# Patient Record
Sex: Male | Born: 1945 | Race: White | Hispanic: No | Marital: Married
Health system: Southern US, Community
[De-identification: ages and names within clinical notes are randomized; demographics above are authoritative.]

---

## 2004-04-18 ENCOUNTER — Ambulatory Visit (HOSPITAL_COMMUNITY): Admission: RE | Admit: 2004-04-18 | Discharge: 2004-04-18 | Payer: Self-pay | Admitting: Gastroenterology

## 2004-12-06 ENCOUNTER — Ambulatory Visit (HOSPITAL_COMMUNITY): Admission: RE | Admit: 2004-12-06 | Discharge: 2004-12-06 | Payer: Self-pay | Admitting: Urology

## 2015-01-09 ENCOUNTER — Other Ambulatory Visit (INDEPENDENT_AMBULATORY_CARE_PROVIDER_SITE_OTHER): Payer: Self-pay | Admitting: General Surgery

## 2015-01-09 DIAGNOSIS — R1031 Right lower quadrant pain: Secondary | ICD-10-CM

## 2015-01-09 NOTE — Addendum Note (Signed)
Addended by: Ernestene MentionINGRAM, Orren Pietsch M on: 01/09/2015 03:04 PM   Modules accepted: Orders

## 2015-01-10 ENCOUNTER — Other Ambulatory Visit (INDEPENDENT_AMBULATORY_CARE_PROVIDER_SITE_OTHER): Payer: Self-pay

## 2015-01-10 DIAGNOSIS — R1031 Right lower quadrant pain: Secondary | ICD-10-CM

## 2015-01-12 ENCOUNTER — Ambulatory Visit
Admission: RE | Admit: 2015-01-12 | Discharge: 2015-01-12 | Disposition: A | Discharge status: Home / Self Care | Payer: Medicare Other | Source: Ambulatory Visit | Attending: General Surgery | Admitting: General Surgery

## 2016-01-03 IMAGING — CT CT PELVIS W/ CM
3 series · 13 of 36 positions shown, 19 images · IV contrast (READICAT/WATER & [ID] ISOVUE 300)
Comparison: 12/05/2004.

CLINICAL DATA: Right groin pain. Previous right inguinal hernia
repair in 8111. History of nephrolithiasis and lithotripsy.

EXAM:
CT PELVIS WITH CONTRAST
TECHNIQUE: Multidetector CT imaging of the pelvis was performed using the
standard protocol following the bolus administration of intravenous
contrast.
CONTRAST:  125 cc Isovue 300

[Series 3: routine pelvis · axial · 0.81mm/px · z∈[-306,-71]mm · 7 of 63 slices shown, 12 images]
[im 8/63  soft-tissue]
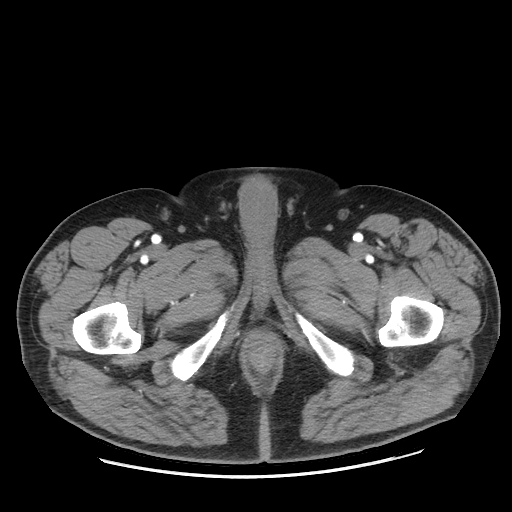
[im 8/63  bone]
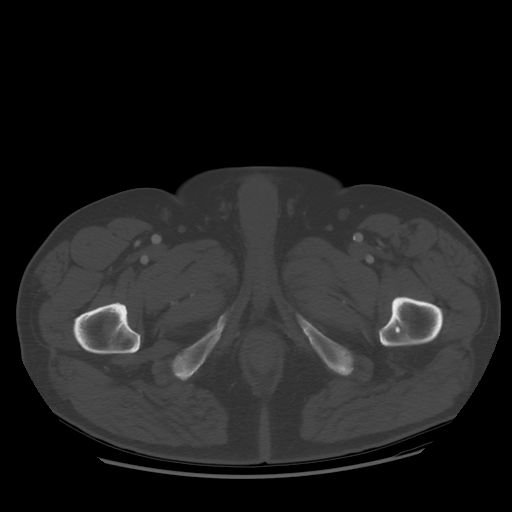
[im 16/63  soft-tissue]
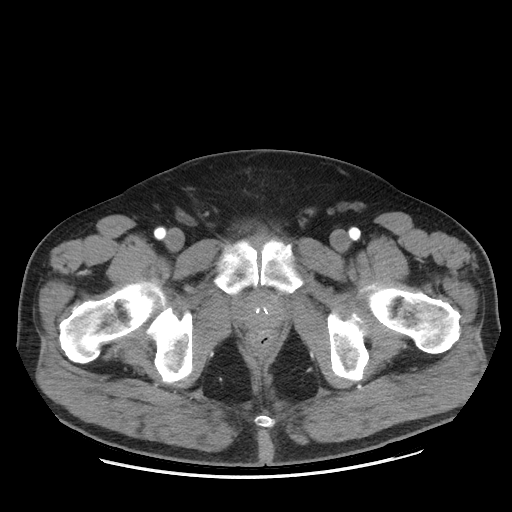
[im 24/63  soft-tissue]
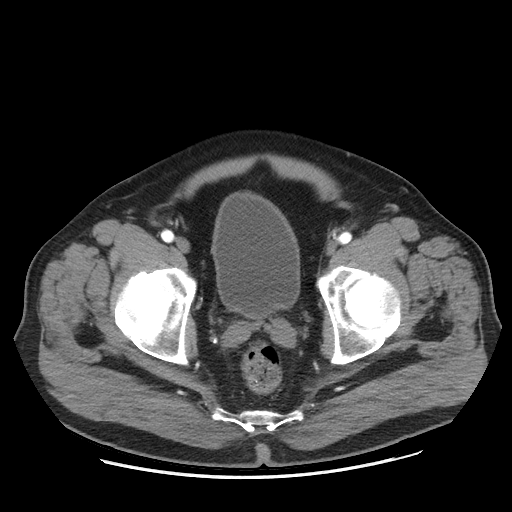
[im 32/63  soft-tissue]
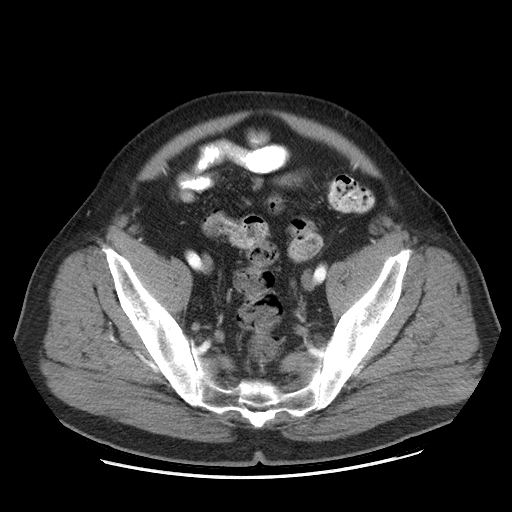
[im 32/63  lung]
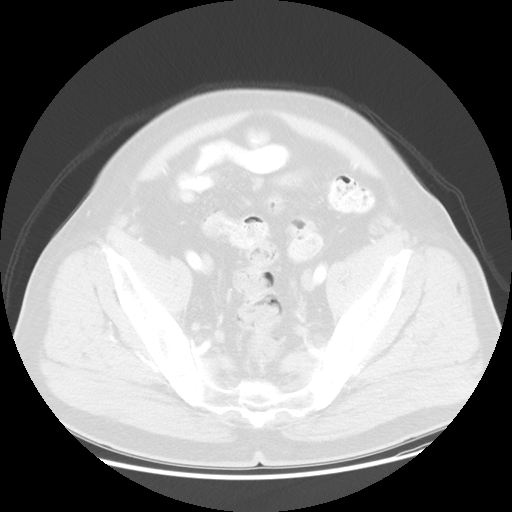
[im 39/63  soft-tissue]
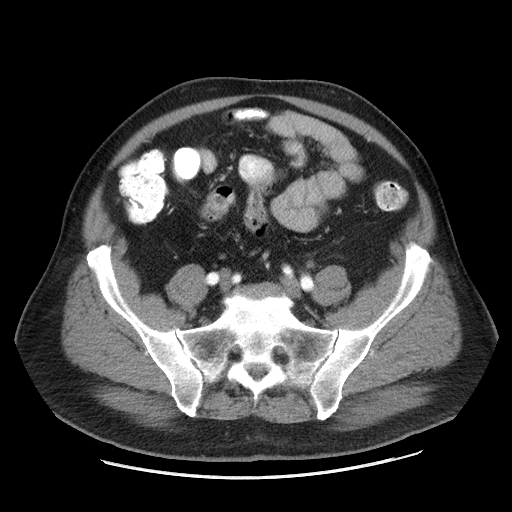
[im 39/63  lung]
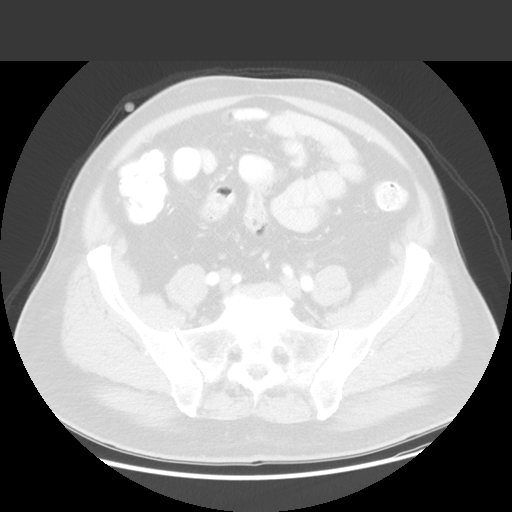
[im 47/63  soft-tissue]
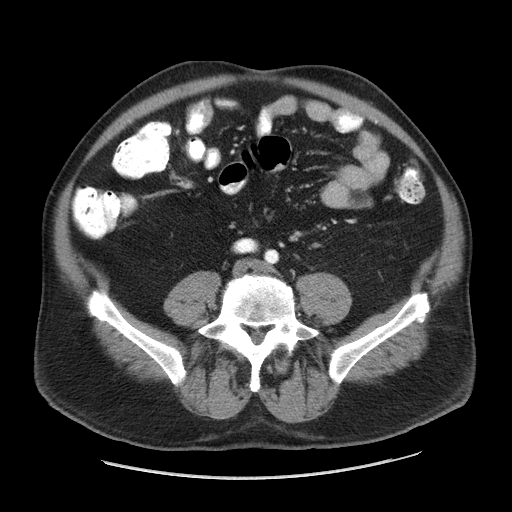
[im 47/63  lung]
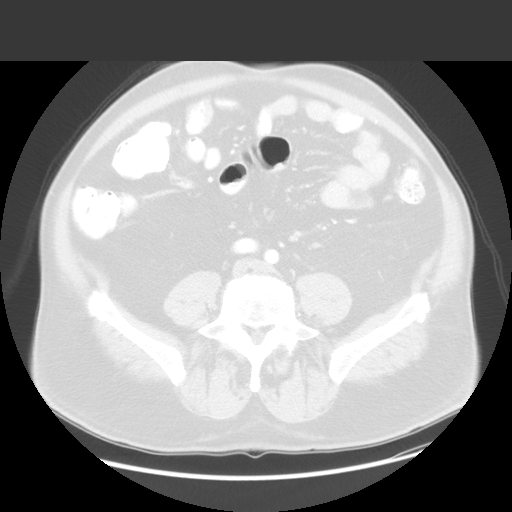
[im 55/63  soft-tissue]
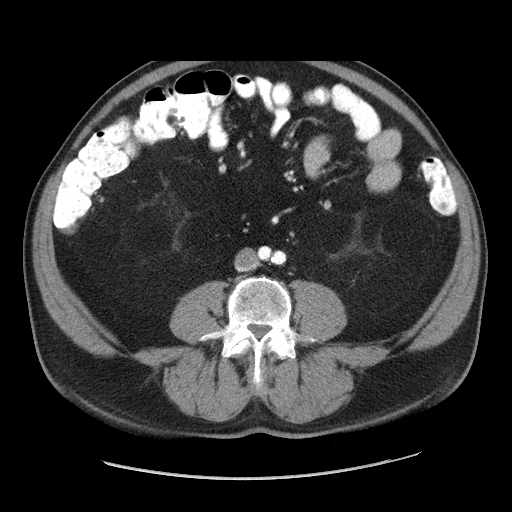
[im 55/63  lung]
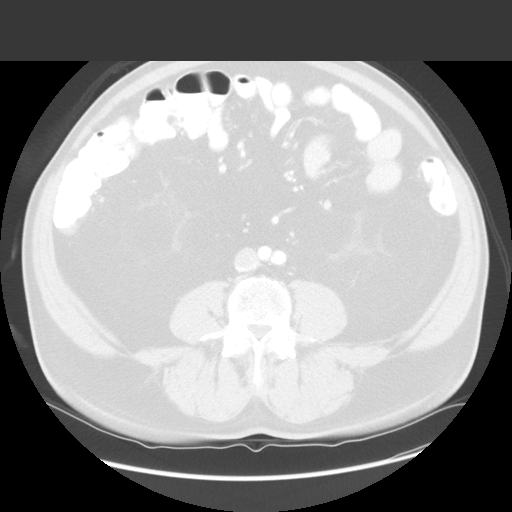

[Series 601: coronal body · coronal · 0.81mm/px · 1 of 142 slices shown, 2 images]
[im 48/142  soft-tissue]
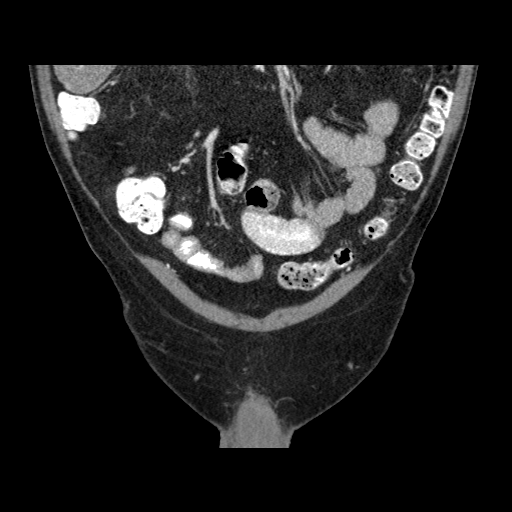
[im 48/142  bone]
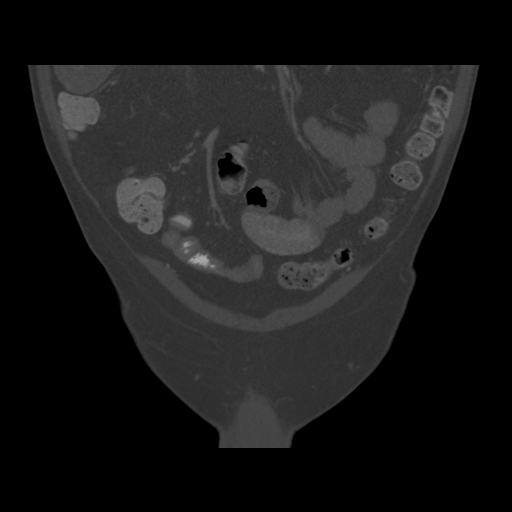

[Series 602: sagittal body · sagittal · 0.81mm/px · 5 of 161 slices shown]
[im 15/161  soft-tissue]
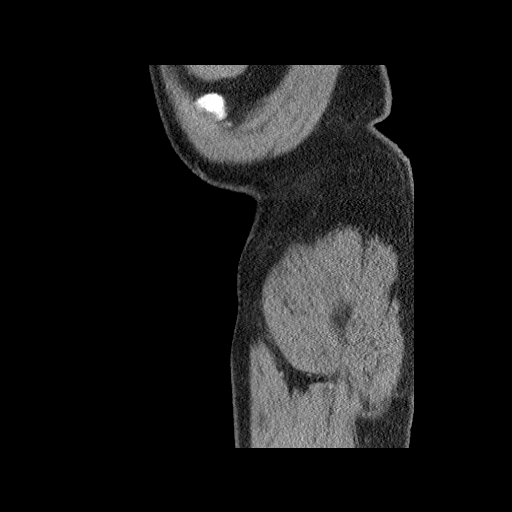
[im 30/161  soft-tissue]
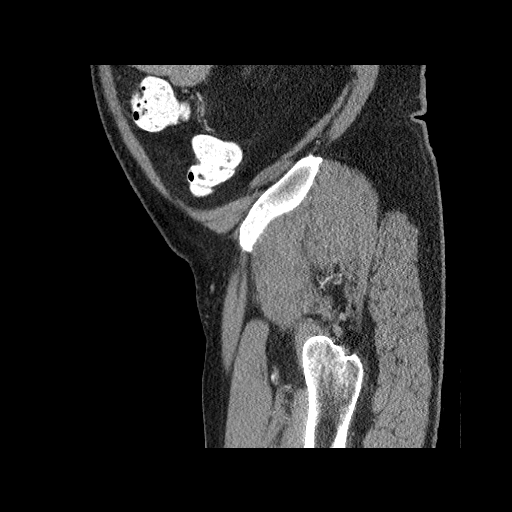
[im 51/161  soft-tissue]
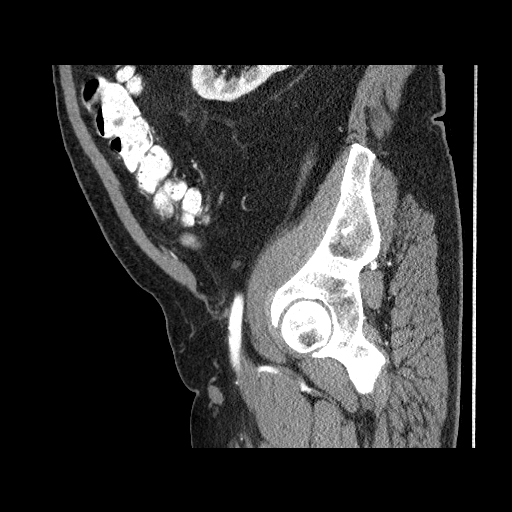
[im 73/161  soft-tissue]
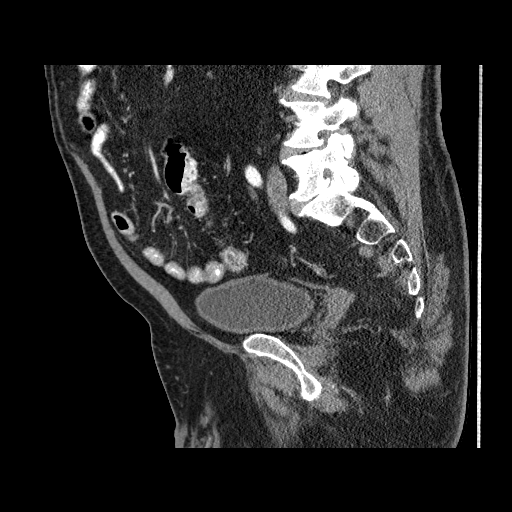
[im 88/161  soft-tissue]
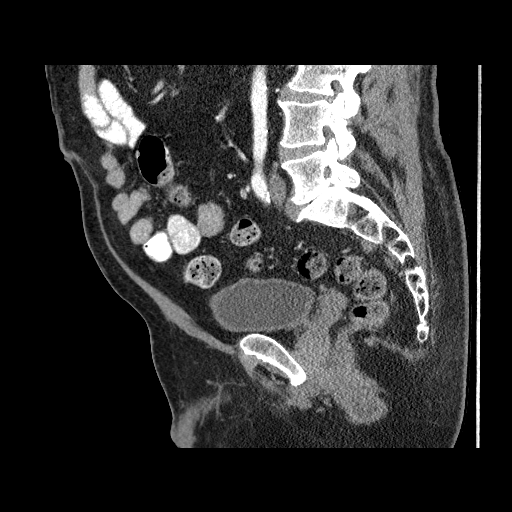

[13 of 36 positions shown; findings below may reference images not displayed]

FINDINGS: The area of pain on the right was marked with the mid vitamin-E
capsule. No hernia or other abnormality seen at that location. No
intestinal abnormalities or enlarged lymph nodes. Normal-sized
prostate gland. Poorly distended urinary bladder with mild diffuse
wall thickening. No inguinal hernia seen on either side. Diffuse low
density of the included portion of the liver. Lower lumbar spine
degenerative changes. Right iliac bone islands. Mild bilateral hip
degenerative changes.
IMPRESSION: 1. No hernia demonstrated.
2. Hepatic steatosis.
3. Lower lumbar spine and bilateral hip degenerative changes.
